# Patient Record
Sex: Male | Born: 1985 | Race: White | Hispanic: No | Marital: Married
Health system: Southern US, Community
[De-identification: ages and names within clinical notes are randomized; demographics above are authoritative.]

## PROBLEM LIST (undated history)

## (undated) DIAGNOSIS — J302 Other seasonal allergic rhinitis: Secondary | ICD-10-CM

## (undated) DIAGNOSIS — J45909 Unspecified asthma, uncomplicated: Secondary | ICD-10-CM

## (undated) DIAGNOSIS — K219 Gastro-esophageal reflux disease without esophagitis: Secondary | ICD-10-CM

## (undated) HISTORY — PX: WRIST FUSION: SHX839

## (undated) HISTORY — PX: TONSILLECTOMY: SUR1361

---

## 2008-11-28 ENCOUNTER — Emergency Department (HOSPITAL_COMMUNITY): Admission: EM | Admit: 2008-11-28 | Discharge: 2008-11-28 | Payer: Self-pay | Admitting: Emergency Medicine

## 2010-11-30 IMAGING — CR DG WRIST COMPLETE 3+V*R*
4 series · 4 of 4 positions shown · non-contrast
Comparison: None

CLINICAL DATA: Status post fall

RIGHT WRIST - COMPLETE 3+ VIEW

[x wrist pa right]
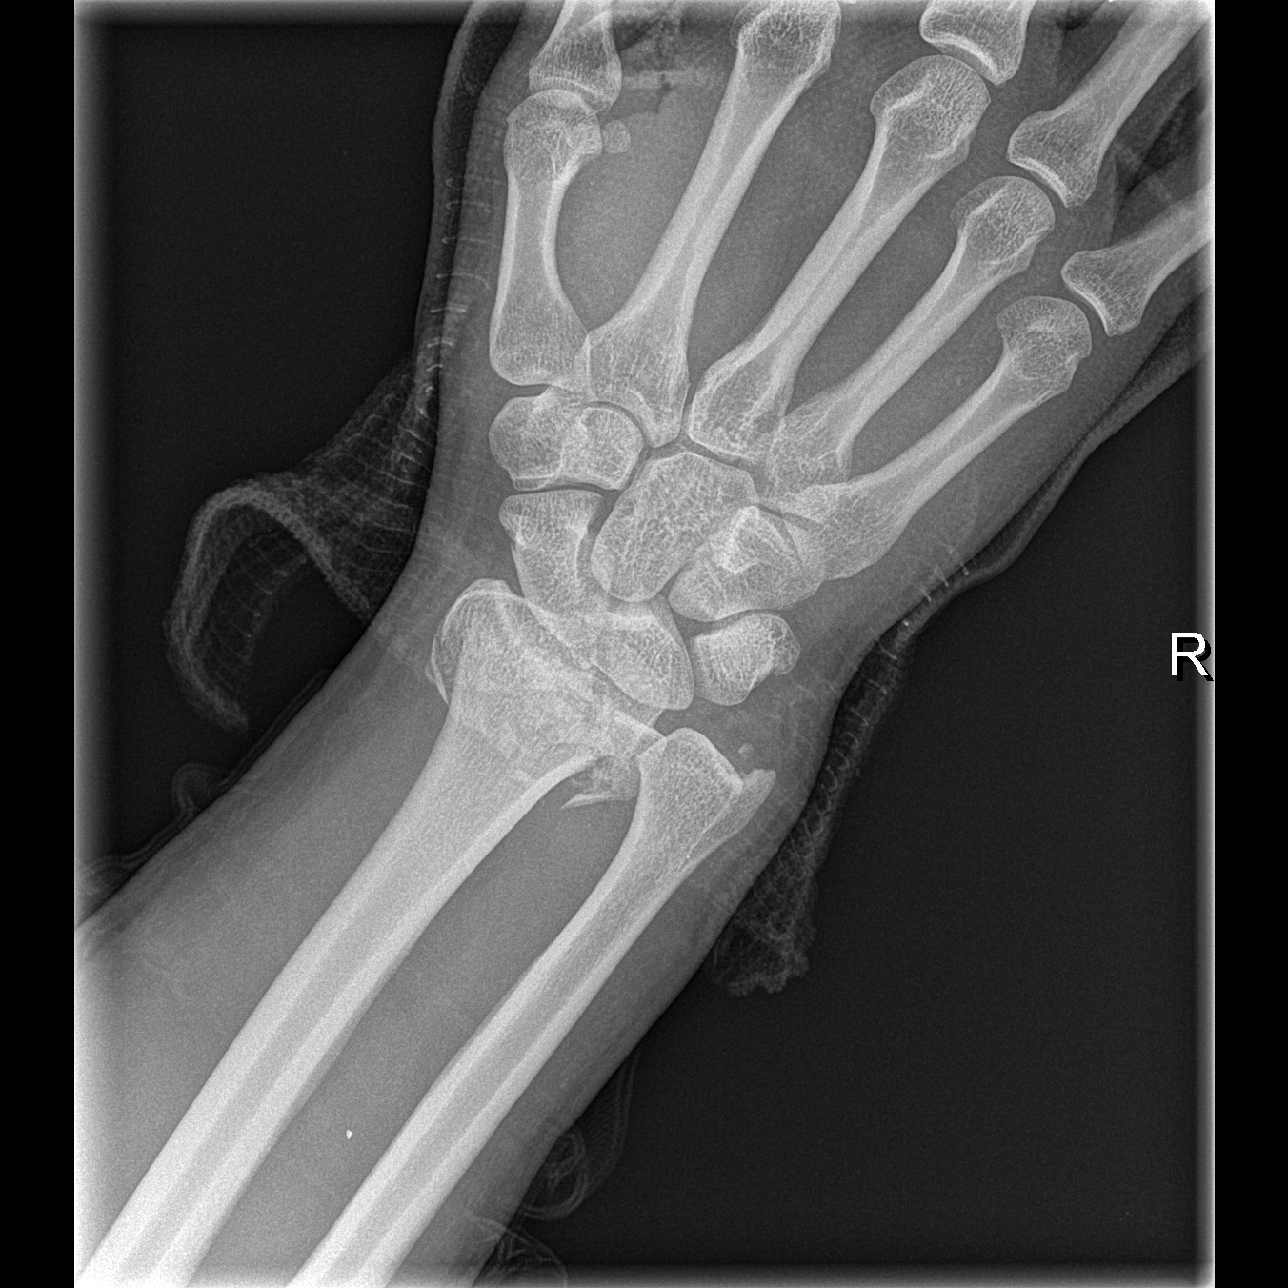

[x wrist obl right]
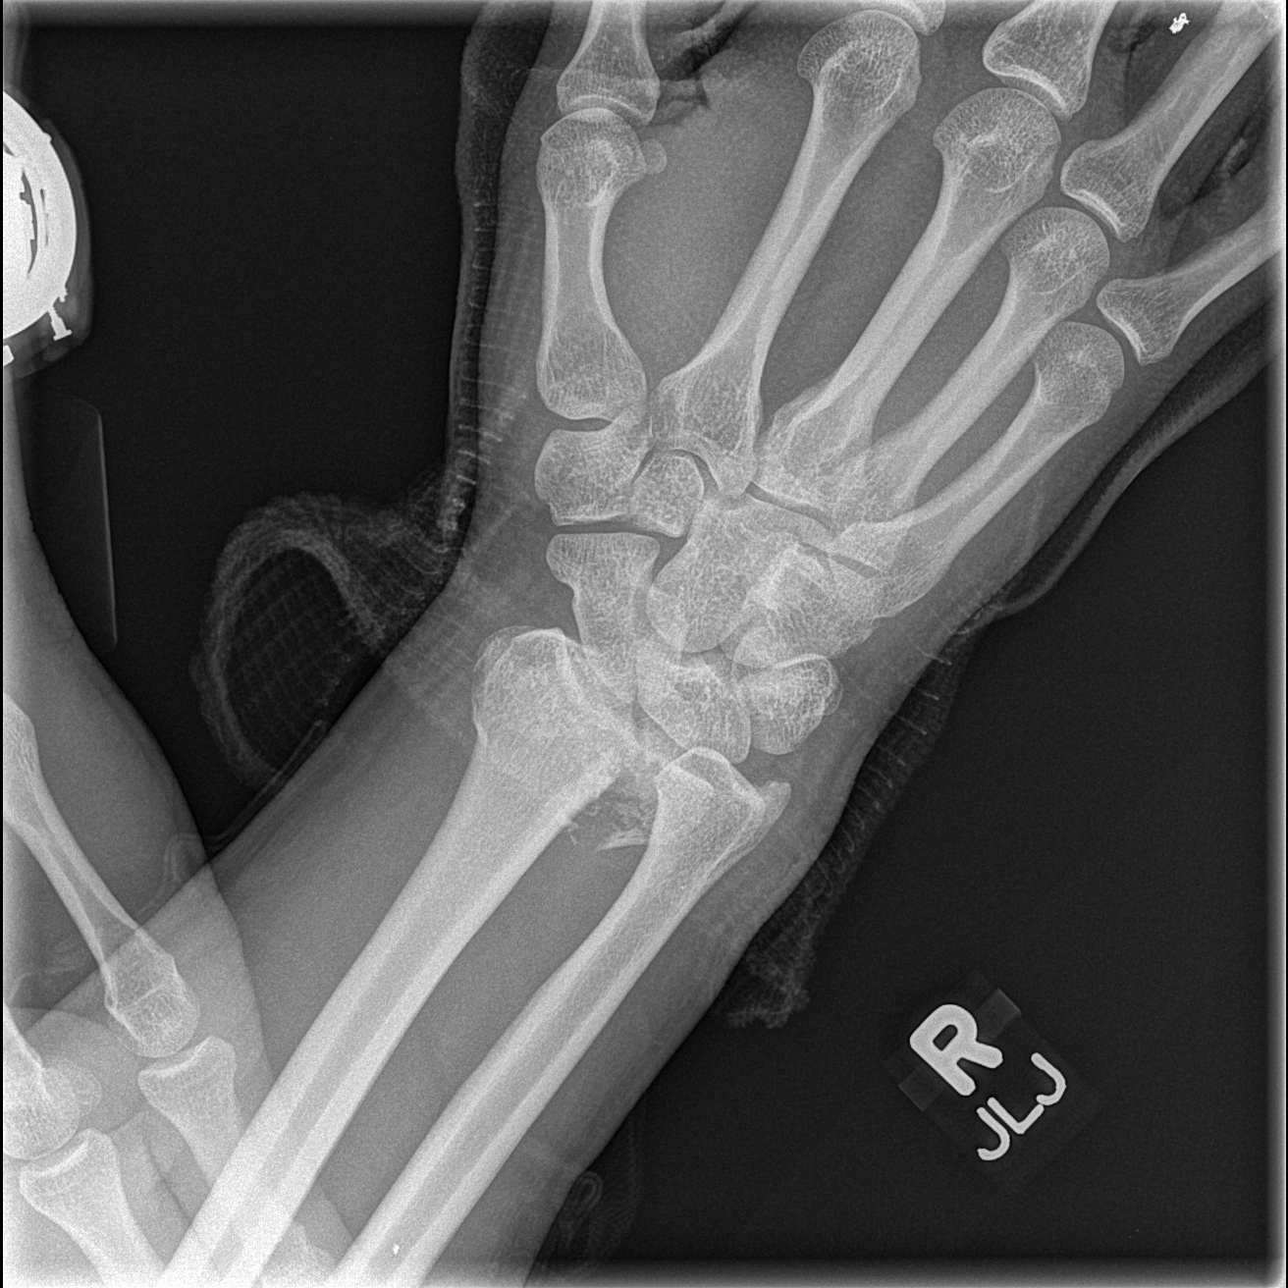

[x wrist lat right]
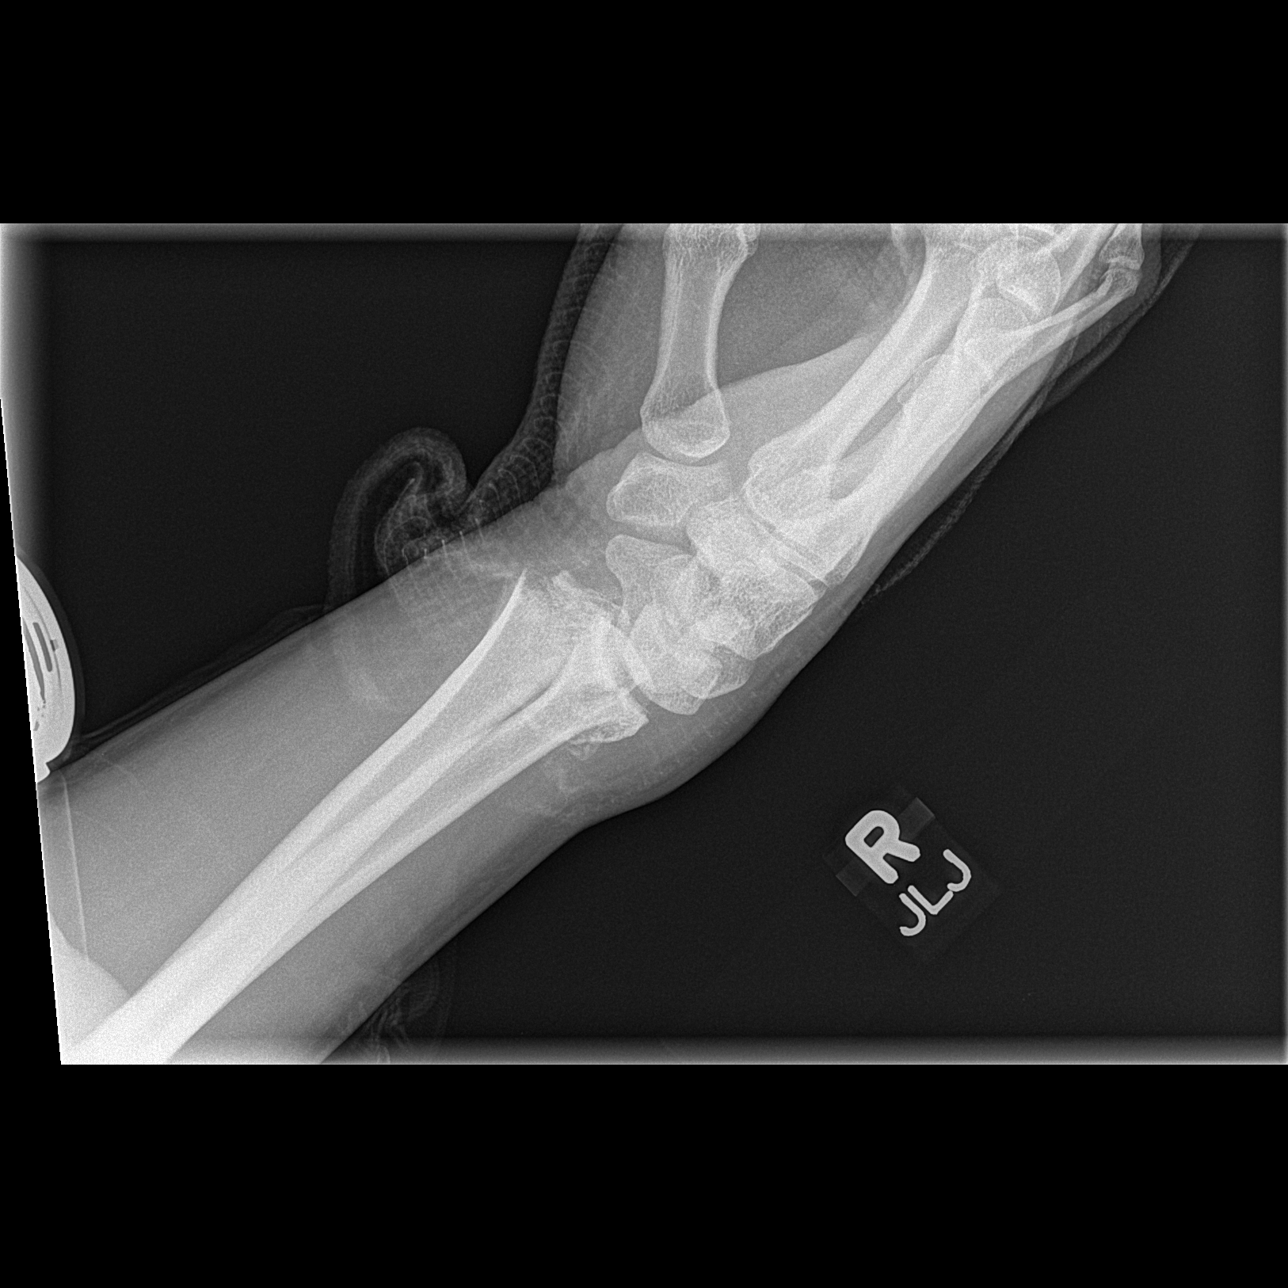

[x wrist navicular]
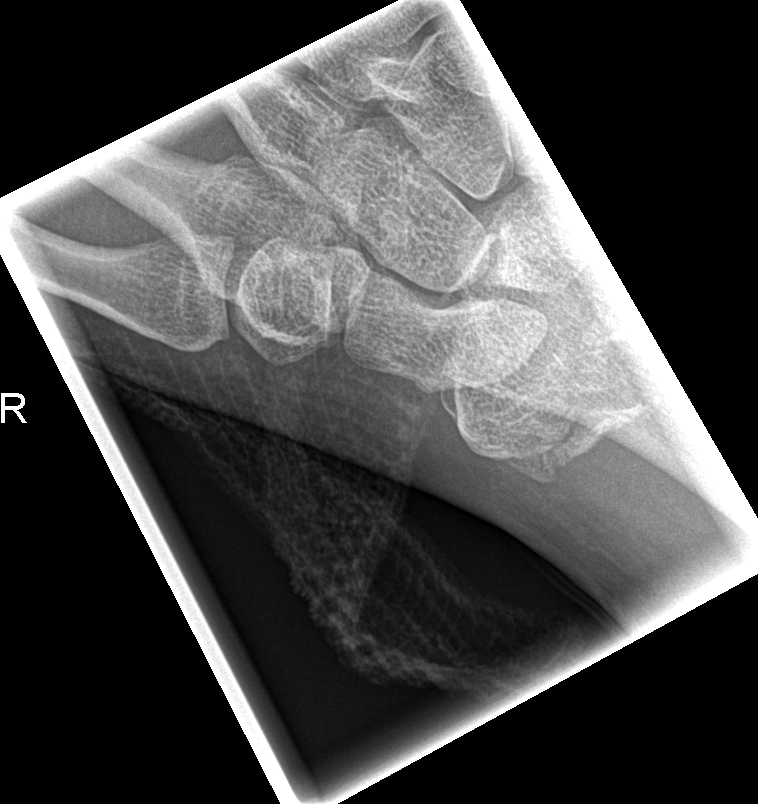

[4 of 4 positions shown; findings below may reference images not displayed]

FINDINGS: Four views submitted.  There is displaced comminuted
fracture of distal right radius.  Displaced fracture of the tip of
the ulnar styloid.
IMPRESSION: Displaced comminuted fracture of distal right radius.  Displaced
fracture of the tip of the ulnar styloid.

## 2020-07-19 ENCOUNTER — Emergency Department (HOSPITAL_BASED_OUTPATIENT_CLINIC_OR_DEPARTMENT_OTHER)
Admission: EM | Admit: 2020-07-19 | Discharge: 2020-07-19 | Disposition: A | Discharge status: Home / Self Care | Payer: Worker's Compensation | Attending: Emergency Medicine | Admitting: Emergency Medicine

## 2020-07-19 ENCOUNTER — Encounter (HOSPITAL_BASED_OUTPATIENT_CLINIC_OR_DEPARTMENT_OTHER): Payer: Self-pay | Admitting: *Deleted

## 2020-07-19 ENCOUNTER — Other Ambulatory Visit: Payer: Self-pay

## 2020-07-19 ENCOUNTER — Emergency Department (HOSPITAL_BASED_OUTPATIENT_CLINIC_OR_DEPARTMENT_OTHER): Payer: Worker's Compensation

## 2020-07-19 DIAGNOSIS — S62666B Nondisplaced fracture of distal phalanx of right little finger, initial encounter for open fracture: Secondary | ICD-10-CM | POA: Insufficient documentation

## 2020-07-19 DIAGNOSIS — Y990 Civilian activity done for income or pay: Secondary | ICD-10-CM | POA: Insufficient documentation

## 2020-07-19 DIAGNOSIS — X500XXA Overexertion from strenuous movement or load, initial encounter: Secondary | ICD-10-CM | POA: Insufficient documentation

## 2020-07-19 DIAGNOSIS — S68119A Complete traumatic metacarpophalangeal amputation of unspecified finger, initial encounter: Secondary | ICD-10-CM

## 2020-07-19 DIAGNOSIS — Z20822 Contact with and (suspected) exposure to covid-19: Secondary | ICD-10-CM | POA: Insufficient documentation

## 2020-07-19 DIAGNOSIS — Z23 Encounter for immunization: Secondary | ICD-10-CM | POA: Insufficient documentation

## 2020-07-19 DIAGNOSIS — Y93F2 Activity, caregiving, lifting: Secondary | ICD-10-CM | POA: Diagnosis not present

## 2020-07-19 DIAGNOSIS — S6991XA Unspecified injury of right wrist, hand and finger(s), initial encounter: Secondary | ICD-10-CM | POA: Diagnosis present

## 2020-07-19 DIAGNOSIS — J45909 Unspecified asthma, uncomplicated: Secondary | ICD-10-CM | POA: Diagnosis not present

## 2020-07-19 HISTORY — DX: Other seasonal allergic rhinitis: J30.2

## 2020-07-19 HISTORY — DX: Gastro-esophageal reflux disease without esophagitis: K21.9

## 2020-07-19 HISTORY — DX: Unspecified asthma, uncomplicated: J45.909

## 2020-07-19 LAB — RESP PANEL BY RT-PCR (FLU A&B, COVID) ARPGX2
Influenza A by PCR: NEGATIVE
Influenza B by PCR: NEGATIVE
SARS Coronavirus 2 by RT PCR: NEGATIVE

## 2020-07-19 MED ORDER — TETANUS-DIPHTH-ACELL PERTUSSIS 5-2.5-18.5 LF-MCG/0.5 IM SUSY: 0.5000 mL | PREFILLED_SYRINGE | Freq: Once | INTRAMUSCULAR | Status: AC

## 2020-07-19 MED ORDER — TETANUS-DIPHTH-ACELL PERTUSSIS 5-2.5-18.5 LF-MCG/0.5 IM SUSY
PREFILLED_SYRINGE | INTRAMUSCULAR | Status: AC
  Administered 2020-07-19: 0.5 mL via INTRAMUSCULAR
  Filled 2020-07-19: qty 0.5

## 2020-07-19 MED ORDER — IBUPROFEN 200 MG PO TABS: 600.0000 mg | ORAL_TABLET | Freq: Four times a day (QID) | ORAL | Status: AC

## 2020-07-19 MED ORDER — HYDROMORPHONE HCL 1 MG/ML IJ SOLN: 1.0000 mg | Freq: Once | INTRAMUSCULAR | Status: AC

## 2020-07-19 MED ORDER — HYDROMORPHONE HCL 1 MG/ML IJ SOLN
INTRAMUSCULAR | Status: AC
  Administered 2020-07-19: 1 mg via INTRAVENOUS
  Filled 2020-07-19: qty 1

## 2020-07-19 MED ORDER — ACETAMINOPHEN 325 MG PO TABS: 650.0000 mg | ORAL_TABLET | Freq: Four times a day (QID) | ORAL | Status: AC

## 2020-07-19 MED ORDER — AMOXICILLIN-POT CLAVULANATE 875-125 MG PO TABS: 1.0000 | ORAL_TABLET | Freq: Two times a day (BID) | ORAL | 0 refills | Status: AC

## 2020-07-19 MED ORDER — OXYCODONE HCL 5 MG PO TABS: 5.0000 mg | ORAL_TABLET | Freq: Four times a day (QID) | ORAL | 0 refills | Status: AC | PRN

## 2020-07-19 MED ORDER — ONDANSETRON HCL 4 MG/2ML IJ SOLN
4.0000 mg | Freq: Once | INTRAMUSCULAR | Status: AC
Start: 2020-07-19 — End: 2020-07-19

## 2020-07-19 MED ORDER — HYDROMORPHONE HCL 1 MG/ML IJ SOLN
1.0000 mg | Freq: Once | INTRAMUSCULAR | Status: AC
  Administered 2020-07-19: 1 mg via INTRAVENOUS
  Filled 2020-07-19: qty 1

## 2020-07-19 MED ORDER — BUPIVACAINE HCL 0.5 % IJ SOLN
50.0000 mL | Freq: Once | INTRAMUSCULAR | Status: AC
  Administered 2020-07-19: 1 mL
  Filled 2020-07-19: qty 1

## 2020-07-19 MED ORDER — CEFAZOLIN SODIUM-DEXTROSE 1-4 GM/50ML-% IV SOLN
1.0000 g | Freq: Once | INTRAVENOUS | Status: AC
  Administered 2020-07-19: 1 g via INTRAVENOUS
  Filled 2020-07-19: qty 50

## 2020-07-19 MED ORDER — ONDANSETRON HCL 4 MG/2ML IJ SOLN
INTRAMUSCULAR | Status: AC
  Administered 2020-07-19: 4 mg
  Filled 2020-07-19: qty 2

## 2020-07-19 MED ORDER — SODIUM CHLORIDE 0.9 % IV SOLN
INTRAVENOUS | Status: DC | PRN
  Administered 2020-07-19: 500 mL via INTRAVENOUS

## 2020-07-19 NOTE — ED Notes (Signed)
Dr Janee Morn, MD at bedside

## 2020-07-19 NOTE — ED Triage Notes (Signed)
C/o left 39fth finger amputation by crushing injury x 10 ins ago , end of finger brought in placed on ice

## 2020-07-19 NOTE — ED Notes (Signed)
AVS provided to client, reviewed information from the Attending MD in re: follow up appt per MD orders, also provided pt education on the medications prescribed (abx and pain med) pt teaching also provided on dsg remaining clean dry and intact and not to remove dsg until follow up with Hand Surgeon in one (1) week. Opportunity for questions provided.

## 2020-07-19 NOTE — ED Provider Notes (Signed)
[facility] EMERGENCY DEPARTMENT Provider Note   CSN: [id number] Arrival date & time: 07/19/20  1512     History Chief Complaint  Patient presents with   Finger Injury    Carlos Kennedy is a 35 y.o. male.  Pt was lifting a table at work and it fell on his hand.  Pt complains of pain right 5th finger.    The history is provided by the patient. No language interpreter was used.  Hand Pain This is a new problem. The current episode started less than 1 hour ago. The problem occurs constantly. The problem has not changed since onset.Nothing aggravates the symptoms. Nothing relieves the symptoms. He has tried nothing for the symptoms. The treatment provided no relief.      Past Medical History:  Diagnosis Date   Asthma    GERD (gastroesophageal reflux disease)    Seasonal allergies     There are no problems to display for this patient.   Past Surgical History:  Procedure Laterality Date   TONSILLECTOMY     WRIST FUSION         History reviewed. No pertinent family history.  Social History   Substance Use Topics   Alcohol use: Yes   Drug use: Not Currently    Home Medications Prior to Admission medications   Not on File    Allergies    Sulfa antibiotics  Review of Systems   Review of Systems  Constitutional:  Negative for chills and fever.  Gastrointestinal:  Negative for vomiting.  Musculoskeletal:  Negative for joint swelling.  Skin:  Positive for wound. Negative for color change and rash.  Neurological:  Positive for seizures and syncope.  All other systems reviewed and are negative.  Physical Exam Updated Vital Signs Ht 5\' 10"  (1.778 m)   Wt 104.3 kg   BMI 33.00 kg/m   Physical Exam Vitals reviewed.  Cardiovascular:     Rate and Rhythm: Normal rate.  Pulmonary:     Effort: Pulmonary effort is normal.  Musculoskeletal:        General: Deformity present.     Comments: Degloved distal tip of right 5th finger.  Bone exposed  Skin:     General: Skin is warm.  Neurological:     General: No focal deficit present.     Mental Status: He is alert.  Psychiatric:        Mood and Affect: Mood normal.         ED Results / Procedures / Treatments   Labs (all labs ordered are listed, but only abnormal results are displayed) Labs Reviewed  RESP PANEL BY RT-PCR (FLU A&B, COVID) [id number]    EKG None  Radiology No results found.  Procedures Procedures   Medications Ordered in ED Medications  ceFAZolin (ANCEF) IVPB 1 g/50 mL premix (1 g Intravenous New Bag/Given 07/19/20 1527)  0.9 %  sodium chloride infusion (has no administration in time range)  HYDROmorphone (DILAUDID) injection 1 mg (1 mg Intravenous Given 07/19/20 1523)  ondansetron (ZOFRAN) injection 4 mg (4 mg Intravenous Given 07/19/20 1522)  Tdap (BOOSTRIX) injection 0.5 mL (0.5 mLs Intramuscular Given 07/19/20 1523)    ED Course  I have reviewed the triage vital signs and the nursing notes.  Pertinent labs & imaging results that were available during my care of the patient were reviewed by me and considered in my medical decision making (see chart for details).    MDM Rules/Calculators/A&P  MDM:  Xray shows fracture of distal phalanx  Pt given Iv ancef and tetanus,   I spoke with Dale Easley,  Dr. Omer Jack surgeon called for consult.  Dr. Janee Morn will see her for repair  Final Clinical Impression(s) / ED Diagnoses Final diagnoses:  Amputation of little finger  Open nondisplaced fracture of distal phalanx of right little finger, initial encounter    Rx / DC Orders ED Discharge Orders     None        Osie Cheeks 07/19/20 1723    Terrilee Files, MD 07/20/20 1048

## 2020-07-19 NOTE — Consult Note (Signed)
ORTHOPAEDIC CONSULTATION HISTORY & PHYSICAL REQUESTING PHYSICIAN: Terrilee Files, MD  Chief Complaint: Right small finger terminal amputation  HPI: Carlos Kennedy is a 35 y.o. male who was at work, when his right small finger became caught between a falling table in the floor, degloving the distal phalanx and creating a fracture of it.  He presented to [facility] ED for evaluation.  Tetanus has been updated he received a gram of Ancef.  The degloved distal portion is present.  Past Medical History:  Diagnosis Date   Asthma    GERD (gastroesophageal reflux disease)    Seasonal allergies    Past Surgical History:  Procedure Laterality Date   TONSILLECTOMY     WRIST FUSION     Social History   Socioeconomic History   Marital status: Single    Spouse name: Not on file   Number of children: Not on file   Years of education: Not on file   Highest education level: Not on file  Occupational History   Not on file  Tobacco Use   Smoking status: Not on file   Smokeless tobacco: Not on file  Substance and Sexual Activity   Alcohol use: Yes   Drug use: Not Currently   Sexual activity: Not on file  Other Topics Concern   Not on file  Social History Narrative   Not on file   Social Determinants of Health   Financial Resource Strain: Not on file  Food Insecurity: Not on file  Transportation Needs: Not on file  Physical Activity: Not on file  Stress: Not on file  Social Connections: Not on file   History reviewed. No pertinent family history. Allergies  Allergen Reactions   Sulfa Antibiotics    Prior to Admission medications   Not on File   DG Finger Little Right  Result Date: 07/19/2020 CLINICAL DATA:  Crush injury EXAM: RIGHT LITTLE FINGER 2+V COMPARISON:  None. FINDINGS: Soft tissue amputation of the distal fifth digit. Acute nondisplaced fractures involving the tuft of the distal phalanx as well as the base of the distal phalanx. Suspect punctate foreign  bodies within the soft tissues at the level of the distal IP joint. IMPRESSION: 1. Soft tissue amputation distal fifth digit with open nondisplaced fractures involving the tuft and base of the distal phalanx. 2. Suspect punctate foreign bodies within the soft tissues at the level of the D IP joint. Electronically Signed   By: Jasmine Pang M.D.   On: 07/19/2020 16:06    Positive ROS: All other systems have been reviewed and were otherwise negative with the exception of those mentioned in the HPI and as above.  Physical Exam: Vitals: Refer to EMR. Constitutional:  WD, WN, NAD HEENT:  NCAT, EOMI Neuro/Psych:  Alert & oriented to person, place, and time; appropriate mood & affect Lymphatic: No generalized extremity edema or lymphadenopathy Extremities / MSK:  The extremities are normal with respect to appearance, ranges of motion, joint stability, muscle strength/tone, sensation, & perfusion except as otherwise noted:  Right small finger tip has been degloved, with a palmar oblique pulp remnant volarly, and even taking the nailbed off of the exposed portion of the distal phalanx, which is from the proximal portion of it distally, rate at the edge of the eponychial fold.  This resulted in circumferential degloving of the exposed distal phalanx.  He has active flexion/extension of the MP and PIP.  Digital block has been performed.  Assessment: Circumferentially degloved right small finger  distal phalanx with transverse fracture to the base with an intra-articular component  Plan/Procedure: I discussed this injury with him and options, that included partial shortening of the distal phalanx, with the expectation need of a tiny nail remnant, the need to perform some volar augmentation of soft tissues, either through healing secondarily or possibly an acute skin graft from the other part.  I also presented to him the option of primary amputation through the DIPJ with essentially primary closure of what  remains, excising any remaining nail matrix at the time.  After careful consideration deliberation, he indicated like to proceed with primary amputation through the DIP joint and consented to such.  After adequate anesthesia the digit was obtained, a tourniquet was applied to it and it was prepped with Betadine and draped in standard sterile fashion.  The wound was irrigated.  The distal phalanx was dissected subperiosteally with a 15 blade and the collateral ligaments divided.  Flexor extensor tendons were found essentially already divided and the distal phalanx was removed.  The distal end of the middle phalanx was then denuded of articular cartilage with a rondure and the end shaped to avoid a bulbous nature.  It was irrigated and then the remaining flaps reapproximated, not too tightly, with some areas still to heal secondarily.  Bacitracin dressing was applied and he was prepared for discharge.  He will be discharged with a week of Augmentin and appropriate analgesic plan consisting of Tylenol, ibuprofen or Aleve, and a few oxycodone for breakthrough pain.  My office will contact him tomorrow to arrange for reevaluation next week and he will leave the dressing undisturbed in the meantime.  We will also plan to have the lab work this week returning to the possibly of light duty on Monday.  Cliffton Asters Janee Morn, MD      Orthopaedic & Hand Surgery [facility] [address] [place], [place]  [address] Office: [phone] Mobile: [phone]  07/19/2020, 4:37 PM

## 2020-07-19 NOTE — ED Notes (Signed)
Dr. Janee Morn in to see pt.

## 2020-07-19 NOTE — Discharge Instructions (Addendum)
Discharge Instructions   You have a dressing on your finger. Move your fingers as much as possible, making a full fist and fully opening the fist. Elevate your hand to reduce pain & swelling of the digits.  Ice over the operative site may be helpful to reduce pain & swelling.  DO NOT USE HEAT. Take take ibuprofen and Tylenol regularly, and save the oxycodone for pain unrelieved by the other medications, to be taken in addition to an in place of the others. Leave the dressing in place until you return to our office.  You may shower, but keep the bandage clean & dry.  You may drive a car when you are off of prescription pain medications and can safely control your vehicle with both hands. Our office will call you to arrange follow-up   Please call [phone] during normal business hours or [phone] after hours for any problems. Including the following:  - excessive redness of the incisions - drainage for more than 4 days - fever of more than 101.5 F  *Please note that pain medications will not be refilled after hours or on weekends.  Work Status: No work the remainder of this week.  May return to work on Monday, 07-24-2020 but restricted to paper/pencil activities of the right hand

## 2020-07-19 NOTE — ED Notes (Signed)
PT LEVEL 2 EMERGENCY, REQUIRED OPIOID ADMINISTRATION, UNABLE TO SIGN MSE, DID GIVE VERBAL CONSENT FOR EMERGENCY TX AND CARE

## 2022-07-21 IMAGING — DX DG FINGER LITTLE 2+V*R*
3 series · 3 of 3 positions shown · non-contrast
Comparison: None.

CLINICAL DATA: Crush injury

EXAM:
RIGHT LITTLE FINGER 2+V

[finger ap]
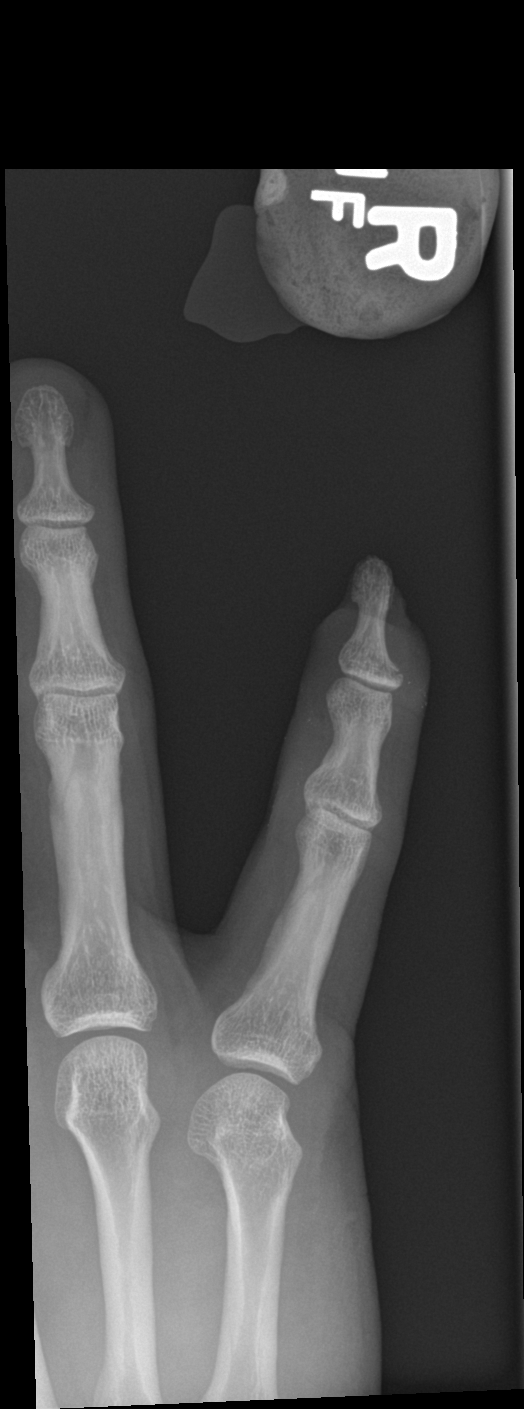

[finger obl]
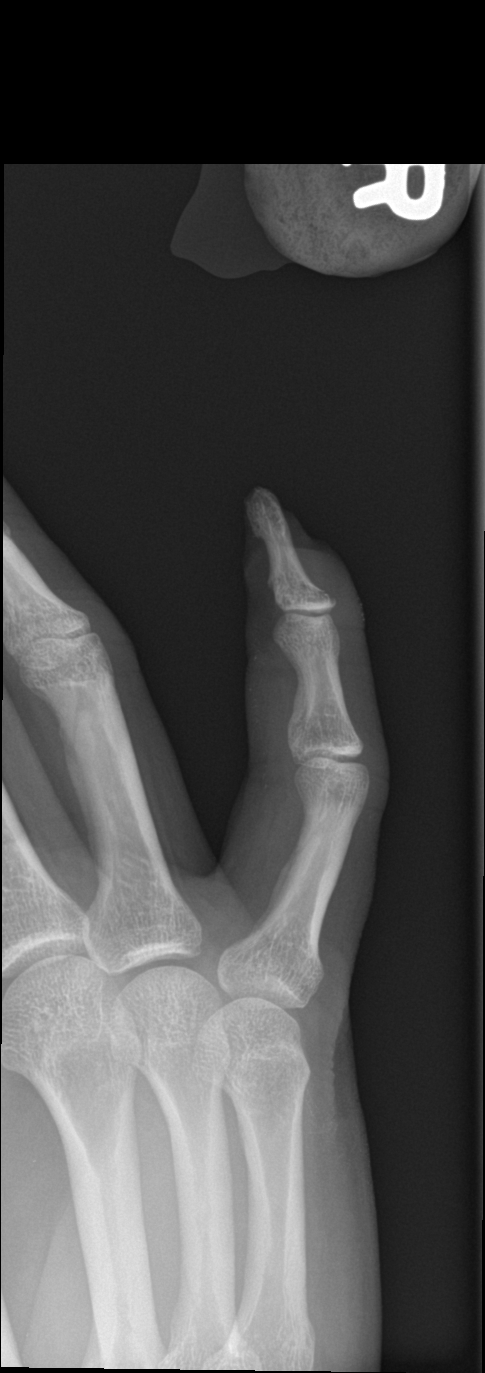

[finger lat]
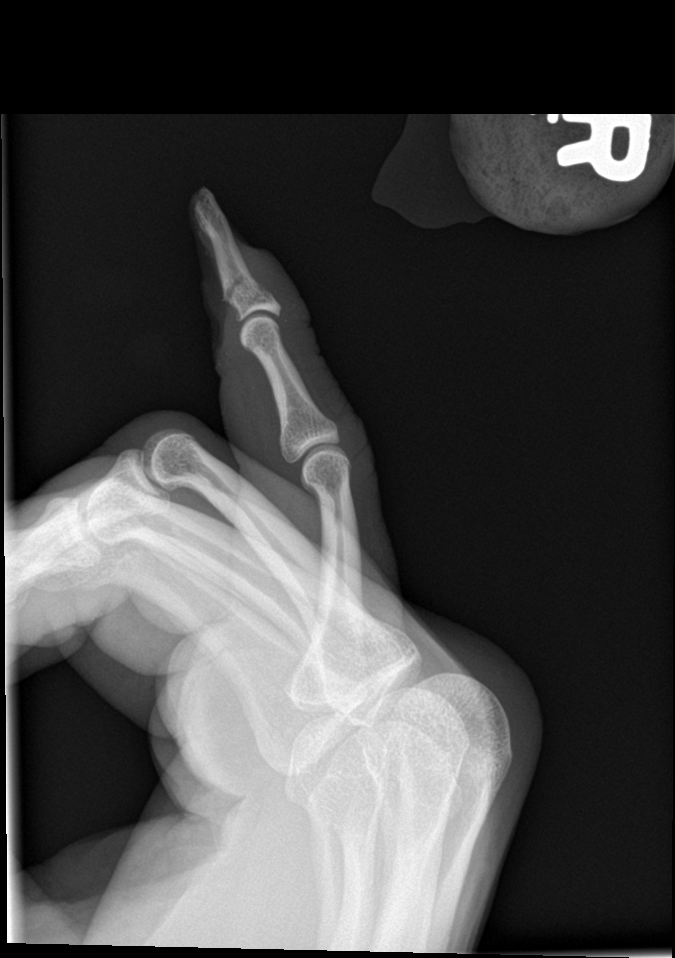

[3 of 3 positions shown; findings below may reference images not displayed]

FINDINGS: Soft tissue amputation of the distal fifth digit. Acute nondisplaced
fractures involving the tuft of the distal phalanx as well as the
base of the distal phalanx. Suspect punctate foreign bodies within
the soft tissues at the level of the distal IP joint.
IMPRESSION: 1. Soft tissue amputation distal fifth digit with open nondisplaced
fractures involving the tuft and base of the distal phalanx.
2. Suspect punctate foreign bodies within the soft tissues at the
level of the D IP joint.
